# Patient Record
Sex: Male | Born: 1970 | Race: White | Hispanic: No | Marital: Single | Smoking: Current every day smoker
Health system: Southern US, Community
[De-identification: ages and names within clinical notes are randomized; demographics above are authoritative.]

## PROBLEM LIST (undated history)

## (undated) DIAGNOSIS — S3992XA Unspecified injury of lower back, initial encounter: Secondary | ICD-10-CM

## (undated) HISTORY — PX: BACK SURGERY: SHX140

## (undated) HISTORY — PX: HERNIA REPAIR: SHX51

---

## 2002-02-21 ENCOUNTER — Emergency Department (HOSPITAL_COMMUNITY): Admission: EM | Admit: 2002-02-21 | Discharge: 2002-02-21 | Payer: Self-pay | Admitting: Emergency Medicine

## 2003-03-30 ENCOUNTER — Emergency Department (HOSPITAL_COMMUNITY): Admission: AD | Admit: 2003-03-30 | Discharge: 2003-03-30 | Payer: Self-pay | Admitting: Internal Medicine

## 2003-04-04 ENCOUNTER — Emergency Department (HOSPITAL_COMMUNITY): Admission: EM | Admit: 2003-04-04 | Discharge: 2003-04-04 | Payer: Self-pay | Admitting: Emergency Medicine

## 2006-05-11 ENCOUNTER — Emergency Department (HOSPITAL_COMMUNITY): Admission: EM | Admit: 2006-05-11 | Discharge: 2006-05-12 | Payer: Self-pay | Admitting: Emergency Medicine

## 2011-10-22 ENCOUNTER — Encounter (HOSPITAL_COMMUNITY): Payer: Self-pay | Admitting: *Deleted

## 2011-10-22 ENCOUNTER — Emergency Department (HOSPITAL_COMMUNITY)
Admission: EM | Admit: 2011-10-22 | Discharge: 2011-10-22 | Disposition: A | Discharge status: Home / Self Care | Payer: Self-pay | Source: Home / Self Care | Attending: Emergency Medicine | Admitting: Emergency Medicine

## 2011-10-22 DIAGNOSIS — S61209A Unspecified open wound of unspecified finger without damage to nail, initial encounter: Secondary | ICD-10-CM

## 2011-10-22 DIAGNOSIS — S61210A Laceration without foreign body of right index finger without damage to nail, initial encounter: Secondary | ICD-10-CM

## 2011-10-22 MED ORDER — HYDROCODONE-ACETAMINOPHEN 5-325 MG PO TABS
2.0000 | ORAL_TABLET | Freq: Once | ORAL | Status: AC
  Administered 2011-10-22: 2 via ORAL

## 2011-10-22 MED ORDER — HYDROCODONE-ACETAMINOPHEN 5-325 MG PO TABS
ORAL_TABLET | ORAL | Status: AC
  Filled 2011-10-22: qty 2

## 2011-10-22 MED ORDER — HYDROCODONE-ACETAMINOPHEN 5-325 MG PO TABS: 2.0000 | ORAL_TABLET | ORAL | Status: AC | PRN

## 2011-10-22 NOTE — ED Provider Notes (Signed)
History     CSN: [id number]  Arrival date & time 10/22/11  4098   First MD Initiated Contact with Patient 10/22/11 1009      Chief Complaint  Patient presents with  . Laceration    (Consider location/radiation/quality/duration/timing/severity/associated sxs/prior treatment) Patient is a 41 y.o. male presenting with skin laceration. The history is provided by the patient. No language interpreter was used.  Laceration  The incident occurred less than 1 hour ago. The laceration is 1 cm in size. The pain is at a severity of 5/10. The pain is moderate. The pain has been constant since onset. His tetanus status is out of date.  Pt complains of a laceration to his right index finger from hedge clippers  History reviewed. No pertinent past medical history.  History reviewed. No pertinent past surgical history.  No family history on file.  History  Substance Use Topics  . Smoking status: Current Everyday Smoker  . Smokeless tobacco: Not on file  . Alcohol Use: No      Review of Systems  Skin: Positive for wound.  All other systems reviewed and are negative.    Allergies  Codeine  Home Medications  No current outpatient prescriptions on file.  BP 150/90  Pulse 91  Temp 98.5 F (36.9 C)  Resp 18  SpO2 96%  Physical Exam  Nursing note and vitals reviewed. Constitutional: He is oriented to person, place, and time. He appears well-developed and well-nourished.  Musculoskeletal:       1cm laceration right index finger  Neurological: He is alert and oriented to person, place, and time. He has normal reflexes.  Skin: Skin is warm and dry.  Psychiatric: He has a normal mood and affect.    ED Course  LACERATION REPAIR Date/Time: 10/22/2011 10:29 AM Performed by: Elson Areas Authorized by: Luiz Blare Consent: Verbal consent obtained. Risks and benefits: risks, benefits and alternatives were discussed Consent given by: patient Time out: Immediately prior  to procedure a "time out" was called to verify the correct patient, procedure, equipment, support staff and site/side marked as required. Body area: upper extremity Location details: right index finger Laceration length: 1 cm Contamination: The wound is contaminated. Foreign bodies: no foreign bodies Tendon involvement: none Nerve involvement: none Skin closure: glue Approximation difficulty: simple Patient tolerance: Patient tolerated the procedure well with no immediate complications.   (including critical care time)  Labs Reviewed - No data to display No results found.   1. Laceration of right index finger w/o foreign body w/o damage to nail       MDM  Splint over finger.   Pt advised to return if any problems.       Lonia Skinner [place], [place] 10/22/11 [address] [place], [place] 10/22/11 1034

## 2011-10-22 NOTE — ED Notes (Addendum)
Pt is here with hedgetrimmer laceration to right pointer finger.  Accident happened this am.  Bandage removed, wound washed and is soaking.

## 2011-10-22 NOTE — Discharge Instructions (Signed)
Fingertip Injuries and Amputations  Fingertip injuries are common and often get injured because they are last to escape when pulling your hand out of harm's way. You have amputated (cut off) part of your finger. How this turns out depends largely on how much was amputated. If just the tip is amputated, often the end of the finger will grow back and the finger may return to much the same as it was before the injury.   If more of the finger is missing, your caregiver has done the best with the tissue remaining to allow you to keep as much finger as is possible. Your caregiver after checking your injury has tried to leave you with a painless fingertip that has durable, feeling skin. If possible, your caregiver has tried to maintain the finger's length and appearance and preserve its fingernail.   Please read the instructions outlined below and refer to this sheet in the next few weeks. These instructions provide you with general information on caring for yourself. Your caregiver may also give you specific instructions. While your treatment has been done according to the most current medical practices available, unavoidable complications occasionally occur. If you have any problems or questions after discharge, please call your caregiver.  HOME CARE INSTRUCTIONS    You may resume normal diet and activities as directed or allowed.   Keep your hand elevated above the level of your heart. This helps decrease pain and swelling.   Keep ice packs (or a bag of ice wrapped in a towel) on the injured area for 15 to 20 minutes, 3 to 4 times per day, for the first two days.   Change dressings if necessary or as directed.   Clean the wound daily or as directed.   Only take over-the-counter or prescription medicines for pain, discomfort, or fever as directed by your caregiver.   Keep appointments as directed.  SEEK IMMEDIATE MEDICAL CARE IF:   You develop redness, swelling, numbness or increasing pain in the wound.   There  is pus coming from the wound.   You develop an unexplained oral temperature above 102 F (38.9 C) or as your caregiver suggests.   There is a foul (bad) smell coming from the wound or dressing.   There is a breaking open of the wound (edges not staying together) after sutures or staples have been removed.  MAKE SURE YOU:    Understand these instructions.   Will watch your condition.   Will get help right away if you are not doing well or get worse.  Document Released: 04/02/2005 Document Revised: 05/01/2011 Document Reviewed: 03/01/2008  ExitCare Patient Information 2012 [employer].

## 2011-10-23 NOTE — ED Provider Notes (Signed)
Medical screening examination/treatment/procedure(s) were performed by non-physician practitioner and as supervising physician I was immediately available for consultation/collaboration.  Luiz Blare MD   Luiz Blare, MD 10/23/11 [phone]

## 2014-01-08 ENCOUNTER — Emergency Department (HOSPITAL_COMMUNITY): Payer: Self-pay

## 2014-01-08 ENCOUNTER — Emergency Department (HOSPITAL_COMMUNITY)
Admission: EM | Admit: 2014-01-08 | Discharge: 2014-01-08 | Disposition: A | Discharge status: Home / Self Care | Payer: Self-pay | Attending: Emergency Medicine | Admitting: Emergency Medicine

## 2014-01-08 ENCOUNTER — Encounter (HOSPITAL_COMMUNITY): Payer: Self-pay | Admitting: Emergency Medicine

## 2014-01-08 DIAGNOSIS — S32009A Unspecified fracture of unspecified lumbar vertebra, initial encounter for closed fracture: Secondary | ICD-10-CM | POA: Insufficient documentation

## 2014-01-08 DIAGNOSIS — S22000A Wedge compression fracture of unspecified thoracic vertebra, initial encounter for closed fracture: Secondary | ICD-10-CM

## 2014-01-08 DIAGNOSIS — Y9389 Activity, other specified: Secondary | ICD-10-CM | POA: Insufficient documentation

## 2014-01-08 DIAGNOSIS — S32000A Wedge compression fracture of unspecified lumbar vertebra, initial encounter for closed fracture: Secondary | ICD-10-CM

## 2014-01-08 DIAGNOSIS — Y990 Civilian activity done for income or pay: Secondary | ICD-10-CM | POA: Insufficient documentation

## 2014-01-08 DIAGNOSIS — Z79899 Other long term (current) drug therapy: Secondary | ICD-10-CM | POA: Insufficient documentation

## 2014-01-08 DIAGNOSIS — S22009A Unspecified fracture of unspecified thoracic vertebra, initial encounter for closed fracture: Secondary | ICD-10-CM | POA: Insufficient documentation

## 2014-01-08 DIAGNOSIS — R109 Unspecified abdominal pain: Secondary | ICD-10-CM | POA: Insufficient documentation

## 2014-01-08 DIAGNOSIS — F17200 Nicotine dependence, unspecified, uncomplicated: Secondary | ICD-10-CM | POA: Insufficient documentation

## 2014-01-08 DIAGNOSIS — IMO0002 Reserved for concepts with insufficient information to code with codable children: Secondary | ICD-10-CM | POA: Insufficient documentation

## 2014-01-08 DIAGNOSIS — Y9289 Other specified places as the place of occurrence of the external cause: Secondary | ICD-10-CM | POA: Insufficient documentation

## 2014-01-08 DIAGNOSIS — W11XXXA Fall on and from ladder, initial encounter: Secondary | ICD-10-CM | POA: Insufficient documentation

## 2014-01-08 LAB — URINALYSIS, ROUTINE W REFLEX MICROSCOPIC
Bilirubin Urine: NEGATIVE
Glucose, UA: NEGATIVE mg/dL
Hgb urine dipstick: NEGATIVE
Ketones, ur: NEGATIVE mg/dL
Leukocytes, UA: NEGATIVE
Nitrite: NEGATIVE
Protein, ur: NEGATIVE mg/dL
Specific Gravity, Urine: 1.026 (ref 1.005–1.030)
Urobilinogen, UA: 0.2 mg/dL (ref 0.0–1.0)
pH: 5.5 (ref 5.0–8.0)

## 2014-01-08 MED ORDER — HYDROMORPHONE HCL PF 2 MG/ML IJ SOLN
2.0000 mg | Freq: Once | INTRAMUSCULAR | Status: AC
  Administered 2014-01-08: 2 mg via INTRAMUSCULAR
  Filled 2014-01-08: qty 1

## 2014-01-08 MED ORDER — IOHEXOL 300 MG/ML  SOLN
50.0000 mL | Freq: Once | INTRAMUSCULAR | Status: AC | PRN
  Administered 2014-01-08: 50 mL via ORAL

## 2014-01-08 MED ORDER — OXYCODONE-ACETAMINOPHEN 5-325 MG PO TABS: 1.0000 | ORAL_TABLET | ORAL | Status: DC | PRN

## 2014-01-08 MED ORDER — IOHEXOL 300 MG/ML  SOLN
100.0000 mL | Freq: Once | INTRAMUSCULAR | Status: AC | PRN
  Administered 2014-01-08: 100 mL via INTRAVENOUS

## 2014-01-08 NOTE — ED Provider Notes (Signed)
CSN: [id number]     Arrival date & time 01/08/14  1818 History   First MD Initiated Contact with Patient 01/08/14 1831     Chief Complaint  Patient presents with  . Back Pain  . Fall     (Consider location/radiation/quality/duration/timing/severity/associated sxs/prior Treatment) HPI This is a 43 year old male who states that he fell off of a roof yesterday while he was working. He states he landed on his back and to her. He denies striking his head or loss of consciousness. He denies that alcohol was involved. He states that he was able to get up at that time. He has noted increased pain through to his back today. He describes it as diffuse from his upper back to his lower back. He denies any lateralized weakness, numbness, or tingling. He has not felt lightheaded, short of breath, or had vomiting. He does have some pain when he takes a deep breath. History reviewed. No pertinent past medical history. History reviewed. No pertinent past surgical history. No family history on file. History  Substance Use Topics  . Smoking status: Current Every Day Smoker  . Smokeless tobacco: Not on file  . Alcohol Use: No    Review of Systems  All other systems reviewed and are negative.     Allergies  Codeine  Home Medications   Prior to Admission medications   Medication Sig Start Date End Date Taking? Authorizing Provider  Aspirin-Acetaminophen (GOODYS BODY PAIN PO) Take 1 each by mouth daily.   Yes Historical Provider, MD   BP 119/58  Pulse 85  Temp(Src) 98.5 F (36.9 C) (Oral)  Resp 18  SpO2 98% Physical Exam  Nursing note and vitals reviewed. Constitutional: He is oriented to person, place, and time. He appears well-developed and well-nourished.  HENT:  Head: Normocephalic and atraumatic.  Right Ear: External ear normal.  Left Ear: External ear normal.  Nose: Nose normal.  Mouth/Throat: Oropharynx is clear and moist.  Eyes: Conjunctivae and EOM are normal. Pupils are equal,  round, and reactive to light.  Neck: Normal range of motion. Neck supple.  Cardiovascular: Normal rate, regular rhythm, normal heart sounds and intact distal pulses.   Pulmonary/Chest: Effort normal and breath sounds normal. No respiratory distress. He has no wheezes. He exhibits no tenderness.  Abdominal: Soft. Bowel sounds are normal. He exhibits no distension and no mass. There is tenderness. There is no guarding.  Mild bilateral tenderness to palpation upper quadrant.  Musculoskeletal: Normal range of motion.  Moderate diffuse tenderness to palpation upper mid and lower back with no specific point tenderness noted.  Neurological: He is alert and oriented to person, place, and time. He has normal reflexes. No cranial nerve deficit. He exhibits normal muscle tone. Coordination normal.  Skin: Skin is warm and dry.  Psychiatric: He has a normal mood and affect. His behavior is normal. Judgment and thought content normal.    ED Course  Procedures (including critical care time) Labs Review Labs Reviewed  URINALYSIS, ROUTINE W REFLEX MICROSCOPIC    Imaging Review Dg Chest 2 View  01/08/2014   CLINICAL DATA:  Status post fall.  Back pain.  EXAM: CHEST  2 VIEW  COMPARISON:  None.  FINDINGS: Heart size and mediastinal contours are within normal limits. Both lungs are clear. Visualized skeletal structures are unremarkable.  IMPRESSION: Negative exam.   Electronically Signed   By: Drusilla Kanner M.D.   On: 01/08/2014 19:28   Dg Cervical Spine Complete  01/08/2014   CLINICAL DATA:  Status post fall.  Neck pain.  EXAM: CERVICAL SPINE  4+ VIEWS  COMPARISON:  None.  FINDINGS: Vertebral body height and alignment are maintained. Intervertebral disc space height is normal with mild anterior endplate spurring noted. The facet joints are unremarkable. Neural foramina appear open. Prevertebral soft tissues are normal and the lung apices are clear.  IMPRESSION: No acute finding with very mild appearing  degenerative change noted.   Electronically Signed   By: Drusilla Kannerhomas  Dalessio M.D.   On: 01/08/2014 19:28   Ct Abdomen Pelvis W Contrast  01/08/2014   CLINICAL DATA:  Larey SeatFell off ladder.  Back and flank pain.  EXAM: CT ABDOMEN AND PELVIS WITH CONTRAST  TECHNIQUE: Multidetector CT imaging of the abdomen and pelvis was performed using the standard protocol following bolus administration of intravenous contrast.  CONTRAST:  50mL OMNIPAQUE IOHEXOL 300 MG/ML SOLN, 100mL OMNIPAQUE IOHEXOL 300 MG/ML SOLN  COMPARISON:  None.  FINDINGS: Lung bases:  The dependent bibasilar atelectasis but no pleural effusion or basilar pneumothorax. The visualized lower left ribs are intact. No definite rib fractures.  CT abdomen:  The solid abdominal organs are intact. No acute injury or mass. The gallbladder is normal. No common bile duct dilatation. The major venous structures are patent. The aorta is normal in caliber. The branch vessels are patent. A circumaortic left renal vein is noted.  The stomach, duodenum, small bowel and colon are unremarkable. No inflammatory changes, mass lesions or obstructive findings. The appendix is normal. No mesenteric or retroperitoneal mass or adenopathy. Small scattered lymph nodes are noted.  CT pelvis:  The bladder, prostate gland and seminal vesicles are unremarkable. No pelvic mass, adenopathy or hematoma. No inguinal mass or adenopathy.  Bony structures:  Both hips are normally located. The pubic symphysis and SI joints are intact. No pelvic fractures. The lumbar vertebral bodies are normally aligned and the facets are normally aligned. There are fractures involving the T12 and L1 vertebral bodies. No retropulsion or canal compromise. No facet or laminar fractures. Small amount of paraspinal hematoma. No transverse process or spinous process fractures.  IMPRESSION: 1. T12 and L1 compression fractures without retropulsion or canal compromise. The posterior elements are intact. 2. No acute  abdominal/pelvic findings.   Electronically Signed   By: Loralie ChampagneMark  Gallerani M.D.   On: 01/08/2014 21:14     MDM   Final diagnoses:  Thoracic compression fracture, closed, initial encounter  Lumbar compression fracture, closed, initial encounter   Patient fell from roof yesterday and has t12 and l1 compression fracture without canal compromis.  He had ct abdomen and neck x-rays and chest x-Audrielle Vankuren negative.   Patient feels improved and is moving without difficulty.  I have discussed injury with patient including return precautions (specifically neuro deficits) and prognosis.  He voices understanding and is given percocet rx.  He is advised and states he will segue to ot meds after these.  He will follow up as needed but states he has no primary care provider.      Hilario Quarryanielle S Nivek Powley, MD 01/09/14 [phone]

## 2014-01-08 NOTE — Discharge Instructions (Signed)
Back, Compression Fracture °A compression fracture happens when a force is put upon the length of your spine. Slipping and falling on your bottom are examples of such a force. When this happens, sometimes the force is great enough to compress the building blocks (vertebral bodies) of your spine. Although this causes a lot of pain, this can usually be treated at home, unless your caregiver feels hospitalization is needed for pain control. °Your backbone (spinal column) is made up of 24 main vertebral bodies in addition to the sacrum and coccyx (see illustration). These are held together by tough fibrous tissues (ligaments) and by support of your muscles. Nerve roots pass through the openings between the vertebrae. A sudden wrenching move, injury, or a fall may cause a compression fracture of one of the vertebral bodies. This may result in back pain or spread of pain into the belly (abdomen), the buttocks, and down the leg into the foot. Pain may also be created by muscle spasm alone. °Large studies have been undertaken to determine the best possible course of action to help your back following injury and also to prevent future problems. The recommendations are as follows. °FOLLOWING A COMPRESSION FRACTURE: °Do the following only if advised by your caregiver.  °· If a back brace has been suggested or provided, wear it as directed. °· Do not stop wearing the back brace unless instructed by your caregiver. °· When allowed to return to regular activities, avoid a sedentary lifestyle. Actively exercise. Sporadic weekend binges of tennis, racquetball, or waterskiing may actually aggravate or create problems, especially if you are not in condition for that activity. °· Avoid sports requiring sudden body movements until you are in condition for them. Swimming and walking are safer activities. °· Maintain good posture. °· Avoid obesity. °· If not already done, you should have a DEXA scan. Based on the results, be treated for  osteoporosis. °FOLLOWING ACUTE (SUDDEN) INJURY: °· Only take over-the-counter or prescription medicines for pain, discomfort, or fever as directed by your caregiver. °· Use bed rest for only the most extreme acute episode. Prolonged bed rest may aggravate your condition. Ice used for acute conditions is effective. Use a large plastic bag filled with ice. Wrap it in a towel. This also provides excellent pain relief. This may be continuous. Or use it for 30 minutes every 2 hours during acute phase, then as needed. Heat for 30 minutes prior to activities is helpful. °· As soon as the acute phase (the time when your back is too painful for you to do normal activities) is over, it is important to resume normal activities and work hardening programs. Back injuries can cause potentially marked changes in lifestyle. So it is important to attack these problems aggressively. °· See your caregiver for continued problems. He or she can help or refer you for appropriate exercises, physical therapy, and work hardening if needed. °· If you are given narcotic medications for your condition, for the next 24 hours do not: °¨ Drive. °¨ Operate machinery or power tools. °¨ Sign legal documents. °· Do not drink alcohol, or take sleeping pills or other medications that may interfere with treatment. °If your caregiver has given you a follow-up appointment, it is very important to keep that appointment. Not keeping the appointment could result in a chronic or permanent injury, pain, and disability. If there is any problem keeping the appointment, you must call back to this facility for assistance.  °SEEK IMMEDIATE MEDICAL CARE IF: °· You develop numbness,   tingling, weakness, or problems with the use of your arms or legs. °· You develop severe back pain not relieved with medications. °· You have changes in bowel or bladder control. °· You have increasing pain in any areas of the body. °Document Released: 05/12/2005 Document Revised:  09/26/2013 Document Reviewed: 12/15/2007 °ExitCare® Patient Information ©2015 [employer]. This information is not intended to replace advice given to you by your health care provider. Make sure you discuss any questions you have with your health care provider. ° °

## 2014-01-08 NOTE — ED Notes (Signed)
Patient transported to CT 

## 2014-01-08 NOTE — ED Notes (Signed)
Pt reports slipping off a ladder that was 14 feet high. Pt states he landed on his back onto the ground, which was dirt (no cement or rocks). Pt denies having a LOC. Pt is A/O x4, ambulatory to triage, vitals are WDL. Pt states it is uncomfortable to sit up and prefers to lay on his side.

## 2014-01-13 ENCOUNTER — Encounter (HOSPITAL_COMMUNITY): Payer: Self-pay | Admitting: Emergency Medicine

## 2014-01-13 ENCOUNTER — Emergency Department (HOSPITAL_COMMUNITY)
Admission: EM | Admit: 2014-01-13 | Discharge: 2014-01-13 | Disposition: A | Discharge status: Home / Self Care | Payer: Self-pay | Attending: Emergency Medicine | Admitting: Emergency Medicine

## 2014-01-13 DIAGNOSIS — S32000S Wedge compression fracture of unspecified lumbar vertebra, sequela: Secondary | ICD-10-CM

## 2014-01-13 DIAGNOSIS — M545 Low back pain, unspecified: Secondary | ICD-10-CM | POA: Insufficient documentation

## 2014-01-13 DIAGNOSIS — Z79899 Other long term (current) drug therapy: Secondary | ICD-10-CM | POA: Insufficient documentation

## 2014-01-13 DIAGNOSIS — G8911 Acute pain due to trauma: Secondary | ICD-10-CM | POA: Insufficient documentation

## 2014-01-13 DIAGNOSIS — R3915 Urgency of urination: Secondary | ICD-10-CM | POA: Insufficient documentation

## 2014-01-13 DIAGNOSIS — F17200 Nicotine dependence, unspecified, uncomplicated: Secondary | ICD-10-CM | POA: Insufficient documentation

## 2014-01-13 DIAGNOSIS — Z7982 Long term (current) use of aspirin: Secondary | ICD-10-CM | POA: Insufficient documentation

## 2014-01-13 DIAGNOSIS — Z87828 Personal history of other (healed) physical injury and trauma: Secondary | ICD-10-CM | POA: Insufficient documentation

## 2014-01-13 DIAGNOSIS — M549 Dorsalgia, unspecified: Secondary | ICD-10-CM | POA: Insufficient documentation

## 2014-01-13 HISTORY — DX: Unspecified injury of lower back, initial encounter: S39.92XA

## 2014-01-13 MED ORDER — OXYCODONE-ACETAMINOPHEN 5-325 MG PO TABS: 1.0000 | ORAL_TABLET | Freq: Three times a day (TID) | ORAL | Status: DC | PRN

## 2014-01-13 NOTE — ED Notes (Signed)
Pt fell from room one week ago. Pt stated that he has increased pain in low back. Evaluated 6 days ago for same concern. Denies numbness and tingling in legs

## 2014-01-13 NOTE — Progress Notes (Signed)
  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 c/o back pain     Subjective/Objective Assessment Detail:   pt states no pcp Has not seen a pain management provider  Confirms no orange card States his girlfriend has one and he has called [facility] without a response at this time     Action/Plan:   ED CM spoke with pt on importance of pcp CM discussed pain management providers   Action/Plan Detail:   Anticipated DC Date:  01/13/2014     Status Recommendation to Physician:   Result of Recommendation:    Other ED Services  Consult Working Plan    DC Planning Services  Other  PCP issues  Outpatient Services - Pt will follow up    Choice offered to / List presented to:            Status of service:  Completed, signed off  ED Comments:   ED Comments Detail:  CM spoke with pt who [place]. CM discussed and provided written information for self pay pcps, importance of pcp for f/u care, www.needymeds.org, discounted pharmacies and other [place] resources such as [facility], [facility], affordable care act,  financial assistance, DSS and  health department Reviewed resources for [place] self pay pcps like [facility], family medicine at [address], [facility], general medical clinics, [facility] plus others, medication resources, [facility] out patient pharmacies and housing Pt voiced understanding and appreciation of resources provided  Provided [facility] contact information Referral completed  referal completed -[facility] [place] [facility] Pt states [facility] awaiting a return call to be seen and orange card assist

## 2014-01-13 NOTE — ED Provider Notes (Signed)
Medical screening examination/treatment/procedure(s) were performed by non-physician practitioner and as supervising physician I was immediately available for consultation/collaboration.   EKG Interpretation None        Purvis SheffieldForrest Chinmayi Rumer, MD 01/13/14 1709

## 2014-01-13 NOTE — ED Provider Notes (Signed)
CSN: [id number]     Arrival date & time 01/13/14  1427 History  This chart was scribed for Fayrene HelperBowie Aliviana Burdell, PA, working with Purvis SheffieldForrest Harrison, MD found by Elon SpannerGarrett Cook, ED Scribe. This patient was seen in room WTR5/WTR5 and the patient's care was started at 2:46 PM.    No chief complaint on file.  The history is provided by the patient. No language interpreter was used.    HPI Comments: Frederick Thompson is a 43 y.o. male who presents to the Emergency Department complaining of constant, unchanged, sharp back pain with intermittent radiation down his left leg through his buttocks.  Patient states he fell of a 14 ft high roof on 8/15 while working on his home.  Patient was seen in the ED on 8/16 and states he he was diagnosed with a lumbar compression fracture.  Patient was referred to PCP but denies having one, patient was told to return to ED if pain persisted.  Patient states he was prescribed Percocet in the ED but took his last half of a tablet this morning.  Patient also states he has taken 6 [employer] today.  Patient reports associated urinary urgency.  Patient denies urinary/bowel incontinence, numbness  No past medical history on file. No past surgical history on file. No family history on file. History  Substance Use Topics  . Smoking status: Current Every Day Smoker  . Smokeless tobacco: Not on file  . Alcohol Use: No    Review of Systems  Genitourinary: Positive for urgency.  Musculoskeletal: Positive for back pain.  Neurological: Negative for numbness.      Allergies  Codeine  Home Medications   Prior to Admission medications   Medication Sig Start Date End Date Taking? Authorizing Provider  Aspirin-Acetaminophen (GOODYS BODY PAIN PO) Take 1 each by mouth daily.    Historical Provider, MD  oxyCODONE-acetaminophen (PERCOCET/ROXICET) 5-325 MG per tablet Take 1 tablet by mouth every 4 (four) hours as needed for severe pain. 01/08/14   Hilario Quarryanielle S Ray, MD   There were no  vitals taken for this visit. Physical Exam  Nursing note and vitals reviewed. Constitutional: He is oriented to person, place, and time. He appears well-developed and well-nourished. No distress.  HENT:  Head: Normocephalic and atraumatic.  Eyes: Conjunctivae and EOM are normal.  Neck: Neck supple. No tracheal deviation present.  Cardiovascular: Normal rate.   Pulmonary/Chest: Effort normal. No respiratory distress.  Musculoskeletal: Normal range of motion.  Tenderness to mid to lower lumbar spin upon palpation no crepitus no step-off.  Paralumbar spine tenderness with no step-off. Patella DTR's intact.  Patient is able to ambulate.    Neurological: He is alert and oriented to person, place, and time.  Skin: Skin is warm and dry.  Psychiatric: He has a normal mood and affect. His behavior is normal.    ED Course  Procedures (including critical care time)  DIAGNOSTIC STUDIES: Oxygen Saturation is 99% on room air, normal by my interpretation.    COORDINATION OF CARE:  2:52 PM persistent back pain from previous T12-L1 compression fx from falling off a roof. No red flags.  Pt is NVI. Able to ambulate. Unable to f/u with PCP or specialist despite multiple attempts.  Discussed with patient the findings of his previous ED visit as well as need to control pain.  Will order pain medication.  Counseled patient on smoking cessation.    Labs Review Labs Reviewed - No data to display  Imaging Review No results found.  EKG Interpretation None      MDM   Final diagnoses:  Compression fracture of lumbar vertebra, sequela    BP 125/72  Pulse 75  Temp(Src) 98.3 F (36.8 C) (Oral)  Resp 20  SpO2 99%   I personally performed the services described in this documentation, which was scribed in my presence. The recorded information has been reviewed and is accurate.      Fayrene Helper, PA-C 01/13/14 1506  Fayrene Helper, PA-C 01/13/14 1517

## 2014-01-13 NOTE — Discharge Instructions (Signed)
You have a T12-L1 compression fracture from your recent fall.  Take pain medication as needed.  Follow up with a specialist for further management.  Return if you develop new numbness, or if you have other concerns.   Back, Compression Fracture A compression fracture happens when a force is put upon the length of your spine. Slipping and falling on your bottom are examples of such a force. When this happens, sometimes the force is great enough to compress the building blocks (vertebral bodies) of your spine. Although this causes a lot of pain, this can usually be treated at home, unless your caregiver feels hospitalization is needed for pain control. Your backbone (spinal column) is made up of 24 main vertebral bodies in addition to the sacrum and coccyx (see illustration). These are held together by tough fibrous tissues (ligaments) and by support of your muscles. Nerve roots pass through the openings between the vertebrae. A sudden wrenching move, injury, or a fall may cause a compression fracture of one of the vertebral bodies. This may result in back pain or spread of pain into the belly (abdomen), the buttocks, and down the leg into the foot. Pain may also be created by muscle spasm alone. Large studies have been undertaken to determine the best possible course of action to help your back following injury and also to prevent future problems. The recommendations are as follows. FOLLOWING A COMPRESSION FRACTURE: Do the following only if advised by your caregiver.   If a back brace has been suggested or provided, wear it as directed.  Do not stop wearing the back brace unless instructed by your caregiver.  When allowed to return to regular activities, avoid a sedentary lifestyle. Actively exercise. Sporadic weekend binges of tennis, racquetball, or waterskiing may actually aggravate or create problems, especially if you are not in condition for that activity.  Avoid sports requiring sudden body  movements until you are in condition for them. Swimming and walking are safer activities.  Maintain good posture.  Avoid obesity.  If not already done, you should have a DEXA scan. Based on the results, be treated for osteoporosis. FOLLOWING ACUTE (SUDDEN) INJURY:  Only take over-the-counter or prescription medicines for pain, discomfort, or fever as directed by your caregiver.  Use bed rest for only the most extreme acute episode. Prolonged bed rest may aggravate your condition. Ice used for acute conditions is effective. Use a large plastic bag filled with ice. Wrap it in a towel. This also provides excellent pain relief. This may be continuous. Or use it for 30 minutes every 2 hours during acute phase, then as needed. Heat for 30 minutes prior to activities is helpful.  As soon as the acute phase (the time when your back is too painful for you to do normal activities) is over, it is important to resume normal activities and work Arboriculturisthardening programs. Back injuries can cause potentially marked changes in lifestyle. So it is important to attack these problems aggressively.  See your caregiver for continued problems. He or she can help or refer you for appropriate exercises, physical therapy, and work hardening if needed.  If you are given narcotic medications for your condition, for the next 24 hours do not:  Drive.  Operate machinery or power tools.  Sign legal documents.  Do not drink alcohol, or take sleeping pills or other medications that may interfere with treatment. If your caregiver has given you a follow-up appointment, it is very important to keep that appointment. Not keeping  the appointment could result in a chronic or permanent injury, pain, and disability. If there is any problem keeping the appointment, you must call back to this facility for assistance.  SEEK IMMEDIATE MEDICAL CARE IF:  You develop numbness, tingling, weakness, or problems with the use of your arms or  legs.  You develop severe back pain not relieved with medications.  You have changes in bowel or bladder control.  You have increasing pain in any areas of the body. Document Released: 05/12/2005 Document Revised: 09/26/2013 Document Reviewed: 12/15/2007 [facility] Patient Information 2015 [place], [place]. This information is not intended to replace advice given to you by your health care provider. Make sure you discuss any questions you have with your health care provider.

## 2014-02-14 ENCOUNTER — Ambulatory Visit: Payer: Self-pay

## 2014-03-20 ENCOUNTER — Emergency Department (HOSPITAL_COMMUNITY)
Admission: EM | Admit: 2014-03-20 | Discharge: 2014-03-20 | Disposition: A | Discharge status: Home / Self Care | Payer: Self-pay | Attending: Emergency Medicine | Admitting: Emergency Medicine

## 2014-03-20 ENCOUNTER — Encounter (HOSPITAL_COMMUNITY): Payer: Self-pay | Admitting: Emergency Medicine

## 2014-03-20 ENCOUNTER — Emergency Department (HOSPITAL_COMMUNITY): Payer: Self-pay

## 2014-03-20 DIAGNOSIS — Y9389 Activity, other specified: Secondary | ICD-10-CM | POA: Insufficient documentation

## 2014-03-20 DIAGNOSIS — S0990XA Unspecified injury of head, initial encounter: Secondary | ICD-10-CM | POA: Insufficient documentation

## 2014-03-20 DIAGNOSIS — S0993XA Unspecified injury of face, initial encounter: Secondary | ICD-10-CM | POA: Insufficient documentation

## 2014-03-20 DIAGNOSIS — W01198A Fall on same level from slipping, tripping and stumbling with subsequent striking against other object, initial encounter: Secondary | ICD-10-CM | POA: Insufficient documentation

## 2014-03-20 DIAGNOSIS — Z79899 Other long term (current) drug therapy: Secondary | ICD-10-CM | POA: Insufficient documentation

## 2014-03-20 DIAGNOSIS — Z87828 Personal history of other (healed) physical injury and trauma: Secondary | ICD-10-CM | POA: Insufficient documentation

## 2014-03-20 DIAGNOSIS — S02609A Fracture of mandible, unspecified, initial encounter for closed fracture: Secondary | ICD-10-CM | POA: Insufficient documentation

## 2014-03-20 DIAGNOSIS — Z720 Tobacco use: Secondary | ICD-10-CM | POA: Insufficient documentation

## 2014-03-20 DIAGNOSIS — Y9289 Other specified places as the place of occurrence of the external cause: Secondary | ICD-10-CM | POA: Insufficient documentation

## 2014-03-20 MED ORDER — AMOXICILLIN 500 MG PO CAPS
500.0000 mg | ORAL_CAPSULE | Freq: Once | ORAL | Status: AC
  Administered 2014-03-20: 500 mg via ORAL
  Filled 2014-03-20: qty 1

## 2014-03-20 MED ORDER — OXYCODONE-ACETAMINOPHEN 5-325 MG PO TABS
1.0000 | ORAL_TABLET | Freq: Once | ORAL | Status: AC
  Administered 2014-03-20: 1 via ORAL
  Filled 2014-03-20: qty 1

## 2014-03-20 MED ORDER — HYDROMORPHONE HCL 1 MG/ML IJ SOLN
1.0000 mg | Freq: Once | INTRAMUSCULAR | Status: AC
  Administered 2014-03-20: 1 mg via INTRAMUSCULAR
  Filled 2014-03-20: qty 1

## 2014-03-20 MED ORDER — OXYCODONE-ACETAMINOPHEN 5-325 MG PO TABS: 1.0000 | ORAL_TABLET | ORAL | Status: DC | PRN

## 2014-03-20 MED ORDER — AMOXICILLIN 500 MG PO CAPS: 500.0000 mg | ORAL_CAPSULE | Freq: Three times a day (TID) | ORAL | Status: DC

## 2014-03-20 NOTE — ED Provider Notes (Signed)
CSN: [id number]     Arrival date & time 03/20/14  1748 History   First MD Initiated Contact with Patient 03/20/14 1937     Chief Complaint  Patient presents with  . Facial Pain  . Head Injury     (Consider location/radiation/quality/duration/timing/severity/associated sxs/prior Treatment) HPI Faythe DingwallRobert Tarpley is a 43 y.o. male with no major medical problems, presents to emergency department complaining of facial swelling and pain. Patient states that he was moving a "2 x 4"4 days ago when it fell and hit him on the face. Patient states since then left-sided facial swelling, unable to open his mouth all the way, unable to bite down without pain. He denies any loss of consciousness, no headache, nausea, vomiting, dizziness, amnesia. No neck pain. No other injuries. He has been taking Goody's powder with no relief of his pain. States left side of the face feels "numb."  Past Medical History  Diagnosis Date  . Back injury    Past Surgical History  Procedure Laterality Date  . Hernia repair     No family history on file. History  Substance Use Topics  . Smoking status: Current Every Day Smoker    Types: Cigarettes  . Smokeless tobacco: Not on file  . Alcohol Use: Yes    Review of Systems  Constitutional: Negative for fever and chills.  HENT: Positive for facial swelling. Negative for dental problem.   Genitourinary: Negative for dysuria, urgency, frequency and hematuria.  Musculoskeletal: Negative for arthralgias.  Skin: Negative for rash.  Allergic/Immunologic: Negative for immunocompromised state.  Neurological: Positive for numbness and headaches. Negative for dizziness, syncope, speech difficulty, weakness and light-headedness.      Allergies  Codeine  Home Medications   Prior to Admission medications   Medication Sig Start Date End Date Taking? Authorizing Provider  Aspirin-Acetaminophen (GOODYS BODY PAIN PO) Take 1 each by mouth daily.   Yes Historical Provider, MD    BP 168/92  Pulse 87  Temp(Src) 98 F (36.7 C) (Oral)  Resp 18  SpO2 98% Physical Exam  Nursing note and vitals reviewed. Constitutional: He is oriented to person, place, and time. He appears well-developed and well-nourished. No distress.  HENT:  Head: Normocephalic and atraumatic.  Swelling to the left mandible. Tender to palpation. No swelling under the tongue. Some trismus present, able to open his mouth to finger widths. Normal maxillary. Multiple dental caries.  Eyes: Conjunctivae and EOM are normal. Pupils are equal, round, and reactive to light.  Neck: Normal range of motion. Neck supple.  No meningismus  Cardiovascular: Normal rate, regular rhythm and normal heart sounds.   Pulmonary/Chest: Effort normal and breath sounds normal. No respiratory distress.  Musculoskeletal: He exhibits no edema.  Neurological: He is alert and oriented to person, place, and time. No cranial nerve deficit. He exhibits normal muscle tone. Coordination normal.  5/5 and equal upper and lower extremity strength bilaterally. Equal grip strength bilaterally. Normal finger to nose and heel to shin. No pronator drift.   Skin: Skin is warm and dry. No rash noted.  Psychiatric: He has a normal mood and affect. His behavior is normal.    ED Course  Procedures (including critical care time) Labs Review Labs Reviewed - No data to display  Imaging Review Ct Maxillofacial Wo Cm  03/20/2014   CLINICAL DATA:  Left-sided facial swelling after being hit by stack of wood.  EXAM: CT MAXILLOFACIAL WITHOUT CONTRAST  TECHNIQUE: Multidetector CT imaging of the maxillofacial structures was performed. Multiplanar CT image  reconstructions were also generated. A small metallic BB was placed on the right temple in order to reliably differentiate right from left.  COMPARISON:  None.  FINDINGS: Paranasal sinuses appear normal. Globes and orbits appear normal. Pterygoid plates appear normal. Severely displaced fracture is  seen involving the left mandible anterior to the mandibular angle and posterior to the molars. No other fracture or other bony abnormality is noted.  IMPRESSION: Severely displaced fracture is seen involving the left mandible, anterior to the mandibular angle and posterior to the molars.   Electronically Signed   By: Roque LiasJames  Green M.D.   On: 03/20/2014 21:18     EKG Interpretation None      MDM   Final diagnoses:  Facial injury  Mandible fracture, closed, initial encounter   Pt with left mandible injury. CT maxillofacial ordered.    10:00 PM Discussed with Dr. Chales Salmonwsley, recommended starting pt on antibiotics -amoxicillin, pain management. Call his office at 8am tomorrow and be seen tomorrow to be set up for surgery. Pt received first dose of amoxil in ED, received 1mg  of dilaudid IM and i percoset prior to d/c for pain.   Filed Vitals:   03/20/14 1808 03/20/14 2015 03/20/14 2207  BP: 144/88 168/92 142/100  Pulse: 96 87 85  Temp: 98.6 F (37 C) 98 F (36.7 C) 98.1 F (36.7 C)  TempSrc:  Oral Oral  Resp: 18 18 18   SpO2: 96% 98% 100%      Lottie Musselatyana A Jeanice Dempsey, PA-C 03/21/14 0026

## 2014-03-20 NOTE — Discharge Instructions (Signed)
Amoxil for infection. Percocet for pain. Follow up with Dr. Chales Salmonwsley, call tomorrow and follow up in the office.    Mandibular Fracture A mandibular fracture is a break in the jawbone. CAUSES  The most common cause of mandibular fracture is a direct blow (trauma) to the jaw. This could happen from:  A car crash.  Physical violence.  A fall from a high place. SYMPTOMS   Pain.  Swelling.  Difficulty and pain when closing the mouth.  Feeling that the teeth are not aligned properly when closing the mouth (malocclusion).  Difficulty speaking.  Difficulty swallowing. DIAGNOSIS  Your caregiver will take your history and perform a physical exam. He or she may also order imaging tests, such as X-rays or a computed tomography (CT) scan, to confirm your diagnosis. TREATMENT  Surgery is often needed to put the jaw back in the right position. Wires are usually placed around the teeth to hold the jaw in place while it heals. Treatment may also include pain medicine, ice, and a soft or liquid diet. HOME CARE INSTRUCTIONS   Put ice on the injured area.  Put ice in a plastic bag.  Place a towel between your skin and the bag.  Leave the ice on for 15-20 minutes, 03-04 times a day for the first 2 days.  Only take over-the-counter or prescription medicines for pain, discomfort, or fever as directed by your caregiver.  Eat a well-balanced, high-protein soft or liquid diet as directed by your caregiver.  If your jaws are wired, follow your caregiver's instructions for wired jaw care.  Sleep on your back to avoid putting pressure on your jaw.  Avoid exercising to the point that you become short of breath. SEEK MEDICAL CARE IF:   You have a severe headache or numbness in your face.  You have severe jaw pain that is not relieved with medicine.  Your jaw wires become loose.  You have uncontrollable nausea or anxiety.  Your swelling or redness gets worse. SEEK IMMEDIATE MEDICAL CARE  IF:  You have a fever.  You have difficulty breathing.  You feel like your airway is tightening.  You cannot swallow your saliva.  You make a high-pitched whistling sound when you breathe (wheezing). MAKE SURE YOU:   Understand these instructions.  Will watch your condition.  Will get help right away if you are not doing well or get worse. Document Released: 05/12/2005 Document Revised: 08/04/2011 Document Reviewed: 05/28/2011 [facility] Patient Information 2015 [place]. This information is not intended to replace advice given to you by your health care provider. Make sure you discuss any questions you have with your health care provider.

## 2014-03-20 NOTE — ED Notes (Signed)
Pt sts he was moving some 2x4 on Thursday and had stack of them fall and hit left side of his head and face. Pt has swelling to left jaw, sts left side of his face and head feels numb and numbness is spreading to his chest.

## 2014-03-21 NOTE — ED Provider Notes (Signed)
Medical screening examination/treatment/procedure(s) were performed by non-physician practitioner and as supervising physician I was immediately available for consultation/collaboration.   EKG Interpretation None        Taliya Mcclard J. Han Lysne, MD 03/21/14 0030 

## 2014-08-30 ENCOUNTER — Emergency Department (HOSPITAL_COMMUNITY)
Admission: EM | Admit: 2014-08-30 | Discharge: 2014-08-30 | Disposition: A | Discharge status: Home / Self Care | Payer: Self-pay | Attending: Emergency Medicine | Admitting: Emergency Medicine

## 2014-08-30 ENCOUNTER — Emergency Department (HOSPITAL_COMMUNITY): Payer: Self-pay

## 2014-08-30 ENCOUNTER — Encounter (HOSPITAL_COMMUNITY): Payer: Self-pay | Admitting: *Deleted

## 2014-08-30 DIAGNOSIS — S3992XA Unspecified injury of lower back, initial encounter: Secondary | ICD-10-CM | POA: Insufficient documentation

## 2014-08-30 DIAGNOSIS — Z720 Tobacco use: Secondary | ICD-10-CM | POA: Insufficient documentation

## 2014-08-30 DIAGNOSIS — Z792 Long term (current) use of antibiotics: Secondary | ICD-10-CM | POA: Insufficient documentation

## 2014-08-30 DIAGNOSIS — M5489 Other dorsalgia: Secondary | ICD-10-CM

## 2014-08-30 DIAGNOSIS — S46911A Strain of unspecified muscle, fascia and tendon at shoulder and upper arm level, right arm, initial encounter: Secondary | ICD-10-CM | POA: Insufficient documentation

## 2014-08-30 DIAGNOSIS — W19XXXA Unspecified fall, initial encounter: Secondary | ICD-10-CM

## 2014-08-30 DIAGNOSIS — Z79899 Other long term (current) drug therapy: Secondary | ICD-10-CM | POA: Insufficient documentation

## 2014-08-30 DIAGNOSIS — Y998 Other external cause status: Secondary | ICD-10-CM | POA: Insufficient documentation

## 2014-08-30 DIAGNOSIS — W11XXXA Fall on and from ladder, initial encounter: Secondary | ICD-10-CM | POA: Insufficient documentation

## 2014-08-30 DIAGNOSIS — Y9389 Activity, other specified: Secondary | ICD-10-CM | POA: Insufficient documentation

## 2014-08-30 DIAGNOSIS — Y9289 Other specified places as the place of occurrence of the external cause: Secondary | ICD-10-CM | POA: Insufficient documentation

## 2014-08-30 DIAGNOSIS — Z87828 Personal history of other (healed) physical injury and trauma: Secondary | ICD-10-CM | POA: Insufficient documentation

## 2014-08-30 MED ORDER — HYDROCODONE-ACETAMINOPHEN 5-325 MG PO TABS
1.0000 | ORAL_TABLET | Freq: Once | ORAL | Status: AC
  Administered 2014-08-30: 1 via ORAL
  Filled 2014-08-30: qty 1

## 2014-08-30 MED ORDER — HYDROCODONE-ACETAMINOPHEN 5-325 MG PO TABS: 1.0000 | ORAL_TABLET | Freq: Four times a day (QID) | ORAL | Status: AC | PRN

## 2014-08-30 NOTE — ED Provider Notes (Signed)
CSN: [id number]     Arrival date & time 08/30/14  1600 History  This chart was scribed for non-physician practitioner, Teressa LowerVrinda Ourania Hamler, NP, working with Raeford RazorStephen Kohut, MD, by Lionel DecemberHatice Demirci, ED Scribe. This patient was seen in room WTR8/WTR8 and the patient's care was started at 4:12 PM.    Chief Complaint  Patient presents with  . Fall  . Back Pain  . Shoulder Pain   (Consider location/radiation/quality/duration/timing/severity/associated sxs/prior Treatment) The history is provided by the patient. No language interpreter was used.    HPI Comments: Frederick DingwallRobert Thompson is a 44 y.o. male who presents to the Emergency Department complaining of low back pain and right shoulder pain after falling off of a 6 ft ladder yesterday and landing on his back.  Patient notes that he was not able to get out of bed this morning and did not lose consciousness with the injury.   Patient reports that he had a back injury previously in October with vertebral fracture.  He has no other complaints today. Denies numbness, incontinence or weakness.   Past Medical History  Diagnosis Date  . Back injury    Past Surgical History  Procedure Laterality Date  . Hernia repair     No family history on file. History  Substance Use Topics  . Smoking status: Current Every Day Smoker    Types: Cigarettes  . Smokeless tobacco: Not on file  . Alcohol Use: Yes    Review of Systems  Musculoskeletal: Positive for back pain.  All other systems reviewed and are negative.     Allergies  Codeine  Home Medications   Prior to Admission medications   Medication Sig Start Date End Date Taking? Authorizing Provider  amoxicillin (AMOXIL) 500 MG capsule Take 1 capsule (500 mg total) by mouth 3 (three) times daily. 03/20/14   Tatyana Kirichenko, PA-C  Aspirin-Acetaminophen (GOODYS BODY PAIN PO) Take 1 each by mouth daily.    Historical Provider, MD  oxyCODONE-acetaminophen (PERCOCET) 5-325 MG per tablet Take 1 tablet by  mouth every 4 (four) hours as needed for severe pain. 03/20/14   Tatyana Kirichenko, PA-C   There were no vitals taken for this visit. Physical Exam  Constitutional: He is oriented to person, place, and time. He appears well-developed and well-nourished. No distress.  HENT:  Head: Normocephalic and atraumatic.  Eyes: Conjunctivae and EOM are normal.  Neck: Neck supple.  Cardiovascular: Normal rate.   Pulmonary/Chest: Effort normal. No respiratory distress.  Musculoskeletal: Normal range of motion.       Cervical back: Normal.       Thoracic back: Normal.       Lumbar back: He exhibits bony tenderness.  Tender in the left right anterior shoulder without deformity. Equal strength an sensation in bilateral lower extremities  Neurological: He is alert and oriented to person, place, and time.  Skin: Skin is warm and dry.  Psychiatric: He has a normal mood and affect. His behavior is normal.  Nursing note and vitals reviewed.   ED Course  Procedures (including critical care time) DIAGNOSTIC STUDIES: Oxygen Saturation is 98% on RA, normal by my interpretation.    COORDINATION OF CARE: 4:15 PM Discussed treatment plan with patient at beside, the patient agrees with the plan and has no further questions at this time.   Labs Review Labs Reviewed - No data to display  Imaging Review Dg Lumbar Spine Complete  08/30/2014   CLINICAL DATA:  Fall, back pain, shoulder pain, fell off ladder 2 days  ago  EXAM: LUMBAR SPINE - COMPLETE 4+ VIEW  COMPARISON:  Sagittal view CT scan 01/08/2014  FINDINGS: Five views of lumbar spine submitted. No acute fracture or subluxation. Stable compression fractures upper endplate of T12 and L1 vertebral bodies. Stable mild anterior spurring upper endplate of L4 vertebral body. Alignment and disc spaces are preserved.  IMPRESSION: No acute fracture or subluxation. Stable mild compression deformity upper endplate of T12 and L1 vertebral body.   Electronically Signed    By: Natasha Mead M.D.   On: 08/30/2014 18:01   Dg Shoulder Right  08/30/2014   CLINICAL DATA:  Larey Seat off ladder 2 days ago, left shoulder pain  EXAM: RIGHT SHOULDER - 2+ VIEW  COMPARISON:  01/08/2014 chest x-ray  FINDINGS: Three views of the right shoulder submitted. No acute fracture or subluxation. Mild degenerative changes AC joint. Glenohumeral joint is preserved. Mild spurring of humeral head.  IMPRESSION: No acute fracture or subluxation.  Mild degenerative changes.   Electronically Signed   By: Natasha Mead M.D.   On: 08/30/2014 17:59     EKG Interpretation None      MDM   Final diagnoses:  Shoulder strain, right, initial encounter  Fall, initial encounter  Other back pain   No acute bony abnormality noted. Pt is neurologically intact. Will treat with pain medication and given ortho referral  I personally performed the services described in this documentation, which was scribed in my presence. The recorded information has been reviewed and is accurate.    Teressa Lower, NP 08/30/14 1610  Raeford Razor, MD 08/30/14 [phone]

## 2014-08-30 NOTE — ED Notes (Signed)
Pt reports fell off of a 6 foot ladder x 3 days ago, landed on his back.  Pt reports low back pain and R shoulder pain.  Pt reports previous injury to back in October.  Pt ambulatory with steady gait.

## 2014-08-30 NOTE — Discharge Instructions (Signed)
Back Pain, Adult °Back pain is very common. The pain often gets better over time. The cause of back pain is usually not dangerous. Most people can learn to manage their back pain on their own.  °HOME CARE  °· Stay active. Start with short walks on flat ground if you can. Try to walk farther each day. °· Do not sit, drive, or stand in one place for more than 30 minutes. Do not stay in bed. °· Do not avoid exercise or work. Activity can help your back heal faster. °· Be careful when you bend or lift an object. Bend at your knees, keep the object close to you, and do not twist. °· Sleep on a firm mattress. Lie on your side, and bend your knees. If you lie on your back, put a pillow under your knees. °· Only take medicines as told by your doctor. °· Put ice on the injured area. °¨ Put ice in a plastic bag. °¨ Place a towel between your skin and the bag. °¨ Leave the ice on for 15-20 minutes, 03-04 times a day for the first 2 to 3 days. After that, you can switch between ice and heat packs. °· Ask your doctor about back exercises or massage. °· Avoid feeling anxious or stressed. Find good ways to deal with stress, such as exercise. °GET HELP RIGHT AWAY IF:  °· Your pain does not go away with rest or medicine. °· Your pain does not go away in 1 week. °· You have new problems. °· You do not feel well. °· The pain spreads into your legs. °· You cannot control when you poop (bowel movement) or pee (urinate). °· Your arms or legs feel weak or lose feeling (numbness). °· You feel sick to your stomach (nauseous) or throw up (vomit). °· You have belly (abdominal) pain. °· You feel like you may pass out (faint). °MAKE SURE YOU:  °· Understand these instructions. °· Will watch your condition. °· Will get help right away if you are not doing well or get worse. °Document Released: 10/29/2007 Document Revised: 08/04/2011 Document Reviewed: 09/13/2013 °[employer]® Patient Information ©2015 [employer], [employer]. This information is not intended  to replace advice given to you by your health care provider. Make sure you discuss any questions you have with your health care provider. ° °

## 2014-10-01 ENCOUNTER — Emergency Department (INDEPENDENT_AMBULATORY_CARE_PROVIDER_SITE_OTHER)
Admission: EM | Admit: 2014-10-01 | Discharge: 2014-10-01 | Disposition: A | Discharge status: Home / Self Care | Payer: Self-pay | Source: Home / Self Care | Attending: Family Medicine | Admitting: Family Medicine

## 2014-10-01 ENCOUNTER — Encounter (HOSPITAL_COMMUNITY): Payer: Self-pay

## 2014-10-01 DIAGNOSIS — M5442 Lumbago with sciatica, left side: Secondary | ICD-10-CM

## 2014-10-01 MED ORDER — KETOROLAC TROMETHAMINE 30 MG/ML IJ SOLN: 30.0000 mg | Freq: Once | INTRAMUSCULAR | Status: DC

## 2014-10-01 MED ORDER — KETOROLAC TROMETHAMINE 60 MG/2ML IM SOLN
INTRAMUSCULAR | Status: AC
  Filled 2014-10-01: qty 2

## 2014-10-01 MED ORDER — BACLOFEN 10 MG PO TABS: 10.0000 mg | ORAL_TABLET | Freq: Three times a day (TID) | ORAL | Status: AC

## 2014-10-01 NOTE — Discharge Instructions (Signed)
USE MEDICINE AS NEEDED AND SEE ORTHOPEDIST IF FURTHER PROBLEMS.

## 2014-10-01 NOTE — ED Notes (Signed)
Reportedly fell from a roof several months ago, and has lingering pain in back. Has not seen an ortho for his problem

## 2014-10-01 NOTE — ED Provider Notes (Signed)
CSN: [id number]     Arrival date & time 10/01/14  [id number] History   First MD Initiated Contact with Patient 10/01/14 1900     Chief Complaint  Patient presents with  . Fall   (Consider location/radiation/quality/duration/timing/severity/associated sxs/prior Treatment) Patient is a 44 y.o. male presenting with back pain. The history is provided by the patient.  Back Pain Location:  Lumbar spine Quality:  Shooting and stabbing Radiates to:  L posterior upper leg Pain severity:  Mild Onset quality:  Gradual Duration:  6 months Progression:  Waxing and waning Chronicity:  Chronic Context comment:  [facility], denies orthopedic care. no gi or gu sx. Relieved by:  None tried Worsened by:  Nothing tried Ineffective treatments:  None tried Associated symptoms: no abdominal pain, no bladder incontinence, no bowel incontinence, no chest pain, no leg pain, no pelvic pain and no weakness     Past Medical History  Diagnosis Date  . Back injury    Past Surgical History  Procedure Laterality Date  . Hernia repair     History reviewed. No pertinent family history. History  Substance Use Topics  . Smoking status: Current Every Day Smoker    Types: Cigarettes  . Smokeless tobacco: Not on file  . Alcohol Use: Yes    Review of Systems  Constitutional: Negative.   Cardiovascular: Negative for chest pain.  Gastrointestinal: Negative.  Negative for abdominal pain and bowel incontinence.  Genitourinary: Negative.  Negative for bladder incontinence and pelvic pain.  Musculoskeletal: Positive for back pain and gait problem. Negative for joint swelling.  Skin: Negative.   Neurological: Negative for weakness.    Allergies  Codeine  Home Medications   Prior to Admission medications   Medication Sig Start Date End Date Taking? Authorizing Provider  amoxicillin (AMOXIL) 500 MG capsule Take 1 capsule (500 mg total) by mouth 3 (three) times daily. 03/20/14    Tatyana Kirichenko, PA-C  Aspirin-Acetaminophen (GOODYS BODY PAIN PO) Take 1 each by mouth daily.    Historical Provider, MD  baclofen (LIORESAL) 10 MG tablet Take 1 tablet (10 mg total) by mouth 3 (three) times daily. FOR BACK PAIN 10/01/14   Linna HoffJames D Laterrica Libman, MD  HYDROcodone-acetaminophen (NORCO/VICODIN) 5-325 MG per tablet Take 1-2 tablets by mouth every 6 (six) hours as needed. 08/30/14   Teressa LowerVrinda Pickering, NP  oxyCODONE-acetaminophen (PERCOCET) 5-325 MG per tablet Take 1 tablet by mouth every 4 (four) hours as needed for severe pain. 03/20/14   Tatyana Kirichenko, PA-C   BP 137/74 mmHg  Pulse 69  Temp(Src) 98.1 F (36.7 C) (Oral)  Resp 20  SpO2 99% Physical Exam  Constitutional: He is oriented to person, place, and time. He appears well-developed and well-nourished.  Musculoskeletal: He exhibits tenderness.       Lumbar back: He exhibits decreased range of motion, tenderness, pain and spasm. He exhibits no swelling, no edema, no deformity and normal pulse.       Back:  Neurological: He is alert and oriented to person, place, and time.  Skin: Skin is warm and dry.  Nursing note and vitals reviewed.   ED Course  Procedures (including critical care time) Labs Review Labs Reviewed - No data to display  Imaging Review No results found.   MDM   1. Midline low back pain with left-sided sciatica        Linna HoffJames D Genene Kilman, MD 10/01/14 [phone]

## 2014-11-01 ENCOUNTER — Encounter (HOSPITAL_COMMUNITY): Payer: Self-pay | Admitting: *Deleted

## 2014-11-01 ENCOUNTER — Emergency Department (HOSPITAL_COMMUNITY)
Admission: EM | Admit: 2014-11-01 | Discharge: 2014-11-02 | Disposition: A | Discharge status: Home / Self Care | Payer: Self-pay | Attending: Emergency Medicine | Admitting: Emergency Medicine

## 2014-11-01 DIAGNOSIS — Z7982 Long term (current) use of aspirin: Secondary | ICD-10-CM | POA: Insufficient documentation

## 2014-11-01 DIAGNOSIS — Z79899 Other long term (current) drug therapy: Secondary | ICD-10-CM | POA: Insufficient documentation

## 2014-11-01 DIAGNOSIS — L02211 Cutaneous abscess of abdominal wall: Secondary | ICD-10-CM | POA: Insufficient documentation

## 2014-11-01 DIAGNOSIS — Z720 Tobacco use: Secondary | ICD-10-CM | POA: Insufficient documentation

## 2014-11-01 NOTE — ED Notes (Signed)
Pt states that he has had a "boil" to his rt hip / waistline area for approx 1 week; pt states that it has progressively gotten worse; pt states that his wife tried squeezing it yesterday with no results

## 2014-11-02 MED ORDER — OXYCODONE-ACETAMINOPHEN 5-325 MG PO TABS
1.0000 | ORAL_TABLET | Freq: Once | ORAL | Status: AC
  Administered 2014-11-02: 1 via ORAL
  Filled 2014-11-02: qty 1

## 2014-11-02 MED ORDER — SULFAMETHOXAZOLE-TRIMETHOPRIM 800-160 MG PO TABS: 1.0000 | ORAL_TABLET | Freq: Two times a day (BID) | ORAL | Status: AC

## 2014-11-02 MED ORDER — SULFAMETHOXAZOLE-TRIMETHOPRIM 800-160 MG PO TABS
1.0000 | ORAL_TABLET | Freq: Once | ORAL | Status: AC
  Administered 2014-11-02: 1 via ORAL
  Filled 2014-11-02: qty 1

## 2014-11-02 MED ORDER — OXYCODONE-ACETAMINOPHEN 5-325 MG PO TABS: 1.0000 | ORAL_TABLET | ORAL | Status: AC | PRN

## 2014-11-02 MED ORDER — LIDOCAINE-EPINEPHRINE (PF) 2 %-1:200000 IJ SOLN
20.0000 mL | Freq: Once | INTRAMUSCULAR | Status: AC
  Administered 2014-11-02: 20 mL via INTRADERMAL
  Filled 2014-11-02: qty 20

## 2014-11-02 NOTE — ED Provider Notes (Signed)
CSN: [id number]     Arrival date & time 11/01/14  2336 History   First MD Initiated Contact with Patient 11/02/14 0007     Chief Complaint  Patient presents with  . Abscess     (Consider location/radiation/quality/duration/timing/severity/associated sxs/prior Treatment) Patient is a 44 y.o. male presenting with abscess. The history is provided by the patient. No language interpreter was used.  Abscess Location:  Torso Torso abscess location:  Abd RLQ Abscess quality: draining, painful and redness   Red streaking: yes   Duration:  1 week Progression:  Worsening Associated symptoms: no fever and no nausea   Associated symptoms comment:  Painful swollen area to RLQ abdominal wall that has been draining. No fever. He presents now with increasing pain and surrounding redness. 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   Past Medical History  Diagnosis Date  . Back injury    Past Surgical History  Procedure Laterality Date  . Hernia repair    . Back surgery     No family history on file. History  Substance Use Topics  . Smoking status: Current Every Day Smoker    Types: Cigarettes  . Smokeless tobacco: Not on file  . Alcohol Use: Yes    Review of Systems  Constitutional: Negative for fever.  Gastrointestinal: Negative for nausea.  Musculoskeletal: Negative for myalgias.  Skin:       See HPI.      Allergies  Codeine  Home Medications   Prior to Admission medications   Medication Sig Start Date End Date Taking? Authorizing Provider  Aspirin-Acetaminophen (GOODYS BODY PAIN PO) Take 2 packets by mouth 3 (three) times daily as needed (back pain).    Yes Historical Provider, MD  baclofen (LIORESAL) 10 MG tablet Take 1 tablet (10 mg total) by mouth 3 (three) times daily. FOR BACK PAIN 10/01/14  Yes Linna Hoff, MD  HYDROcodone-acetaminophen (NORCO/VICODIN) 5-325 MG per tablet Take 1-2 tablets by mouth every 6 (six) hours as needed. Patient not taking: Reported on 11/02/2014 08/30/14    Teressa Lower, NP   BP 118/76 mmHg  Pulse 92  Temp(Src) 98.1 F (36.7 C) (Oral)  Resp 22  Ht  (1.676 m)  Wt 155 lb (70.308 kg)  BMI 25.03 kg/m2  SpO2 98% Physical Exam  Constitutional: He is oriented to person, place, and time. He appears well-developed and well-nourished.  Neck: Normal range of motion.  Pulmonary/Chest: Effort normal.  Musculoskeletal: Normal range of motion.  Neurological: He is alert and oriented to person, place, and time.  Skin: Skin is warm and dry.  Large indurated swollen area RLQ abdomen with central ulceration. No drainage. Erythema surrounding ulceration extending approximately 6 cm medially and laterally   Psychiatric: He has a normal mood and affect.    ED Course  Procedures (including critical care time) Labs Review Labs Reviewed - No data to display  Imaging Review No results found.   EKG Interpretation None     INCISION AND DRAINAGE Performed by: Elpidio Anis A Consent: Verbal consent obtained. Risks and benefits: risks, benefits and alternatives were discussed Type: abscess  Body area: RLQ abdomen  Anesthesia: local infiltration  Incision was made with a scalpel.  Local anesthetic: lidocaine 2% w/epinephrine  Anesthetic total: 3 ml  Complexity: complex Blunt dissection to break up loculations  Drainage: purulent  Drainage amount: small  Packing material: 1/4 in iodoform gauze  Patient tolerance: Patient tolerated the procedure well with no immediate complications.    MDM  Final diagnoses:  None    1. Cutaneous abscess  Uncomplicated abscess, I&D, mild cellulitis. Well appearing patient. Pain management provided with Septra for infection. REcommended 2 day recheck.     Elpidio Anis, PA-C 11/02/14 0103  Elwin Mocha, MD 11/02/14 [phone]

## 2014-11-02 NOTE — Discharge Instructions (Signed)

## 2015-01-06 ENCOUNTER — Emergency Department (HOSPITAL_COMMUNITY)
Admission: EM | Admit: 2015-01-06 | Discharge: 2015-01-06 | Disposition: A | Discharge status: Home / Self Care | Payer: Self-pay

## 2015-01-06 NOTE — ED Notes (Signed)
1x call no answer.  

## 2015-01-06 NOTE — ED Notes (Signed)
Patient has been caled back 4 times per registration they think they saw patient walk out. This Clinical research associate has checked outside, no one out there.

## 2015-01-06 NOTE — ED Notes (Signed)
Bed: WTR8 Expected date:  Expected time:  Means of arrival:  Comments: 

## 2015-08-09 ENCOUNTER — Encounter (HOSPITAL_COMMUNITY): Payer: Self-pay | Admitting: Emergency Medicine

## 2015-08-09 ENCOUNTER — Emergency Department (HOSPITAL_COMMUNITY)
Admission: EM | Admit: 2015-08-09 | Discharge: 2015-08-09 | Disposition: A | Discharge status: Home / Self Care | Payer: Self-pay | Attending: Emergency Medicine | Admitting: Emergency Medicine

## 2015-08-09 ENCOUNTER — Emergency Department (HOSPITAL_COMMUNITY): Payer: Self-pay

## 2015-08-09 DIAGNOSIS — T71164A Asphyxiation due to hanging, undetermined, initial encounter: Secondary | ICD-10-CM

## 2015-08-09 DIAGNOSIS — R21 Rash and other nonspecific skin eruption: Secondary | ICD-10-CM | POA: Insufficient documentation

## 2015-08-09 DIAGNOSIS — Y9289 Other specified places as the place of occurrence of the external cause: Secondary | ICD-10-CM | POA: Insufficient documentation

## 2015-08-09 DIAGNOSIS — F1410 Cocaine abuse, uncomplicated: Secondary | ICD-10-CM | POA: Insufficient documentation

## 2015-08-09 DIAGNOSIS — T71191A Asphyxiation due to mechanical threat to breathing due to other causes, accidental, initial encounter: Secondary | ICD-10-CM | POA: Insufficient documentation

## 2015-08-09 DIAGNOSIS — Y9389 Activity, other specified: Secondary | ICD-10-CM | POA: Insufficient documentation

## 2015-08-09 DIAGNOSIS — Y998 Other external cause status: Secondary | ICD-10-CM | POA: Insufficient documentation

## 2015-08-09 DIAGNOSIS — F17210 Nicotine dependence, cigarettes, uncomplicated: Secondary | ICD-10-CM | POA: Insufficient documentation

## 2015-08-09 DIAGNOSIS — S1093XA Contusion of unspecified part of neck, initial encounter: Secondary | ICD-10-CM | POA: Insufficient documentation

## 2015-08-09 DIAGNOSIS — F1210 Cannabis abuse, uncomplicated: Secondary | ICD-10-CM | POA: Insufficient documentation

## 2015-08-09 DIAGNOSIS — X838XXA Intentional self-harm by other specified means, initial encounter: Secondary | ICD-10-CM | POA: Insufficient documentation

## 2015-08-09 LAB — CBC WITH DIFFERENTIAL/PLATELET
BASOS ABS: 0 10*3/uL (ref 0.0–0.1)
Basophils Relative: 0 %
EOS ABS: 0.1 10*3/uL (ref 0.0–0.7)
EOS PCT: 2 %
HCT: 45.7 % (ref 39.0–52.0)
HEMOGLOBIN: 15.6 g/dL (ref 13.0–17.0)
LYMPHS ABS: 1.3 10*3/uL (ref 0.7–4.0)
LYMPHS PCT: 21 %
MCH: 31 pg (ref 26.0–34.0)
MCHC: 34.1 g/dL (ref 30.0–36.0)
MCV: 90.9 fL (ref 78.0–100.0)
Monocytes Absolute: 0.4 10*3/uL (ref 0.1–1.0)
Monocytes Relative: 6 %
NEUTROS PCT: 71 %
Neutro Abs: 4.5 10*3/uL (ref 1.7–7.7)
PLATELETS: 198 10*3/uL (ref 150–400)
RBC: 5.03 MIL/uL (ref 4.22–5.81)
RDW: 14.2 % (ref 11.5–15.5)
WBC: 6.3 10*3/uL (ref 4.0–10.5)

## 2015-08-09 LAB — I-STAT CHEM 8, ED
BUN: 5 mg/dL — AB (ref 6–20)
CHLORIDE: 104 mmol/L (ref 101–111)
Calcium, Ion: 1.12 mmol/L (ref 1.12–1.23)
Creatinine, Ser: 0.7 mg/dL (ref 0.61–1.24)
Glucose, Bld: 79 mg/dL (ref 65–99)
HCT: 49 % (ref 39.0–52.0)
Hemoglobin: 16.7 g/dL (ref 13.0–17.0)
POTASSIUM: 4.2 mmol/L (ref 3.5–5.1)
SODIUM: 141 mmol/L (ref 135–145)
TCO2: 23 mmol/L (ref 0–100)

## 2015-08-09 LAB — RAPID URINE DRUG SCREEN, HOSP PERFORMED
AMPHETAMINES: NOT DETECTED
BENZODIAZEPINES: NOT DETECTED
Barbiturates: NOT DETECTED
COCAINE: POSITIVE — AB
OPIATES: NOT DETECTED
TETRAHYDROCANNABINOL: POSITIVE — AB

## 2015-08-09 LAB — ETHANOL: ALCOHOL ETHYL (B): 27 mg/dL — AB (ref <5)
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
## 2015-08-09 NOTE — Discharge Instructions (Signed)
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
## 2015-08-09 NOTE — ED Notes (Signed)
Pt here from jail after attempting  To hang himself  With a shoelace pt was red according to jail staff. Pt in nad  On arrival

## 2016-09-11 IMAGING — CT CT CERVICAL SPINE W/O CM
2 of 3 series · 10 of 27 positions shown, 13 images · non-contrast
Comparison: None

CLINICAL DATA: Attempted to hang himself with a shoe lace

EXAM:
CT CERVICAL SPINE WITHOUT CONTRAST
TECHNIQUE: Multidetector CT imaging of the cervical spine was performed without
intravenous contrast.
Multiplanar CT image reconstructions were also generated.

[Series 205: sag · sagittal · 0.40mm/px · 5 of 47 slices shown, 6 images]
[im 16/47  bone]
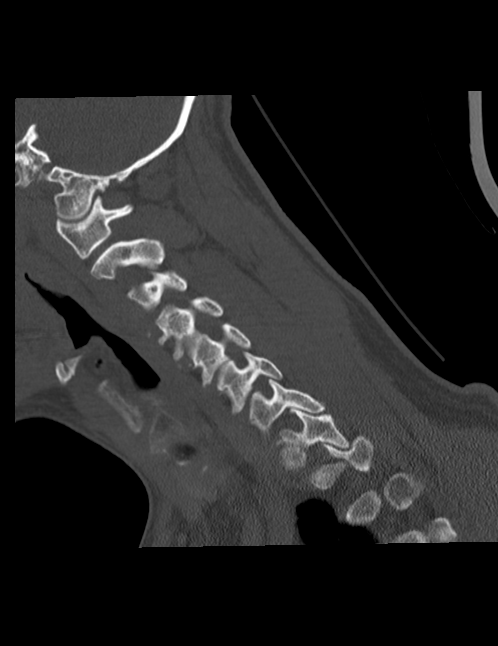
[im 20/47  bone]
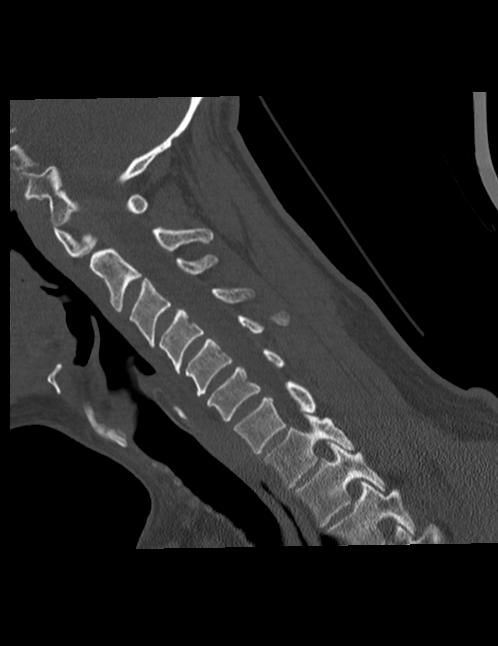
[im 24/47  soft-tissue]
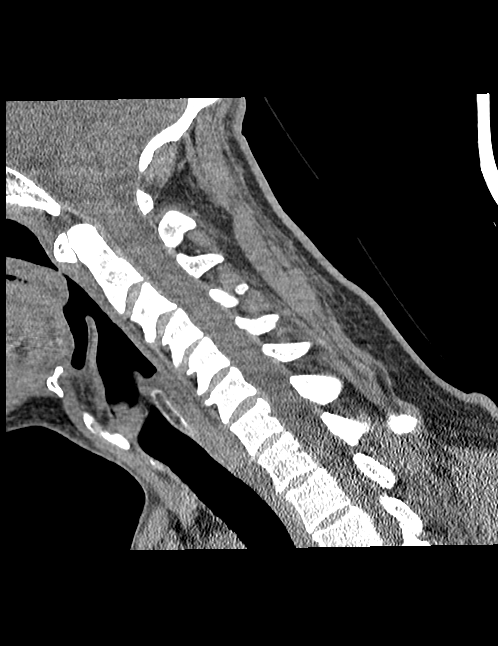
[im 24/47  bone]
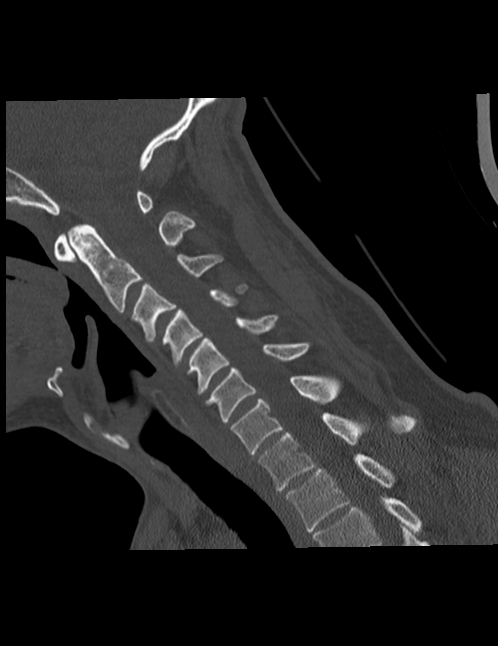
[im 27/47  bone]
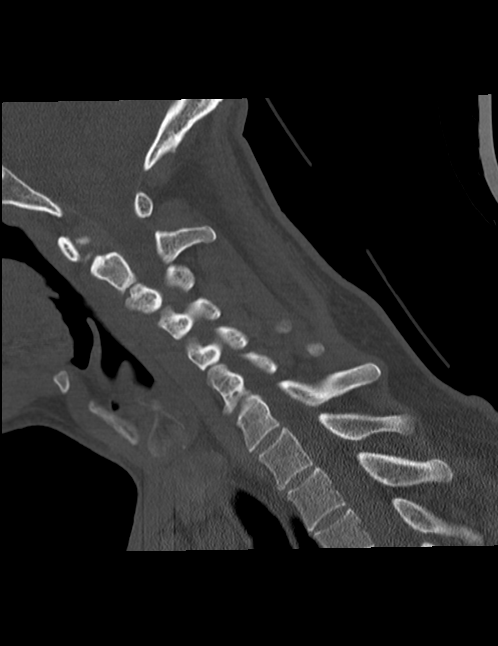
[im 31/47  bone]
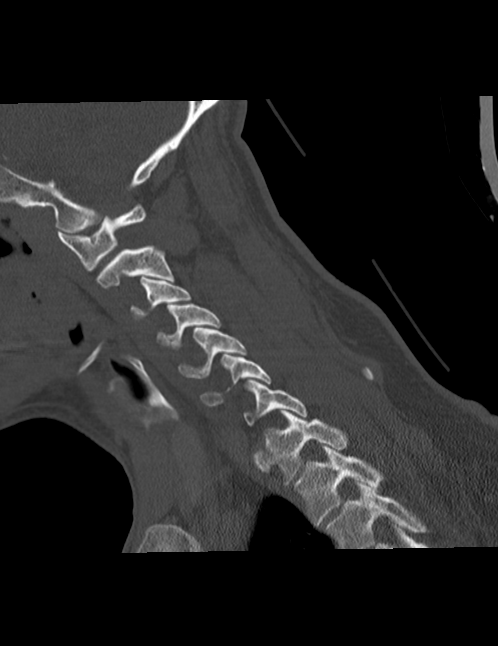

[Series 206: orthogonal · axial · 0.39mm/px · z∈[+80,+175]mm · 5 of 85 slices shown, 7 images]
[im 15/85  soft-tissue]
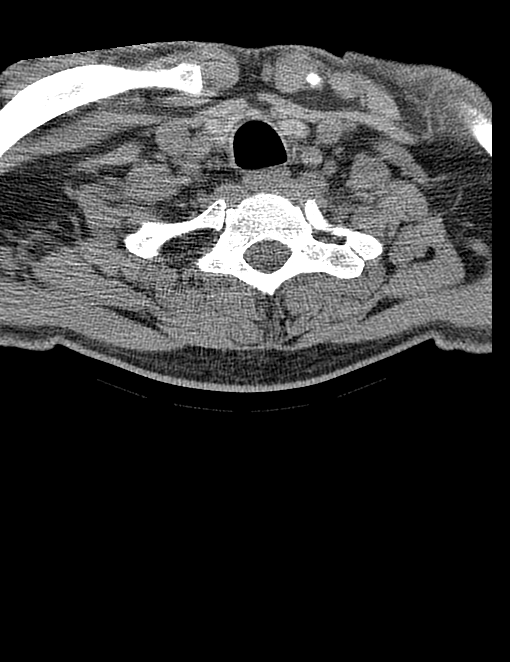
[im 15/85  bone]
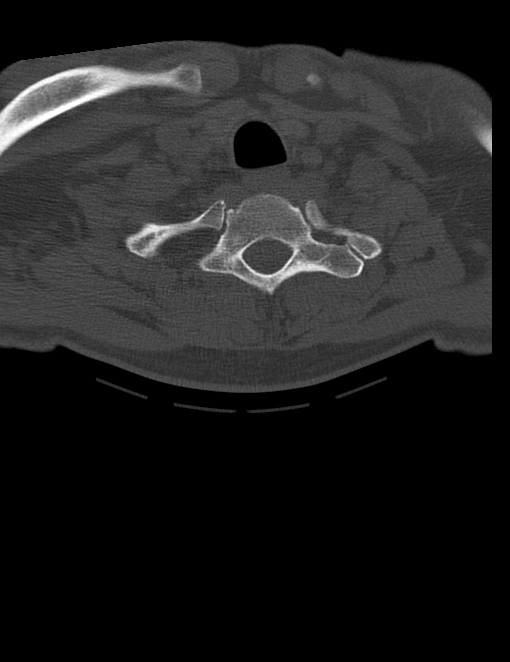
[im 29/85  bone]
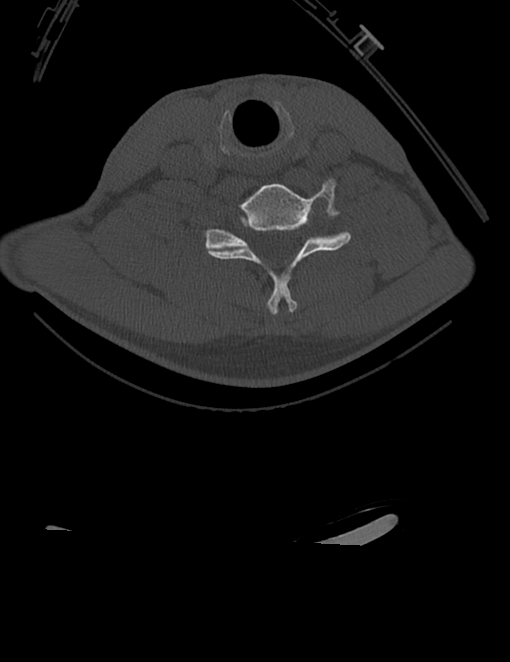
[im 43/85  bone]
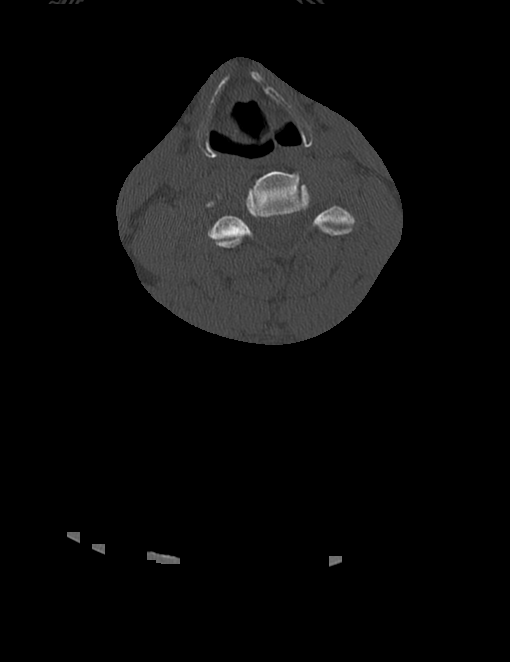
[im 57/85  bone]
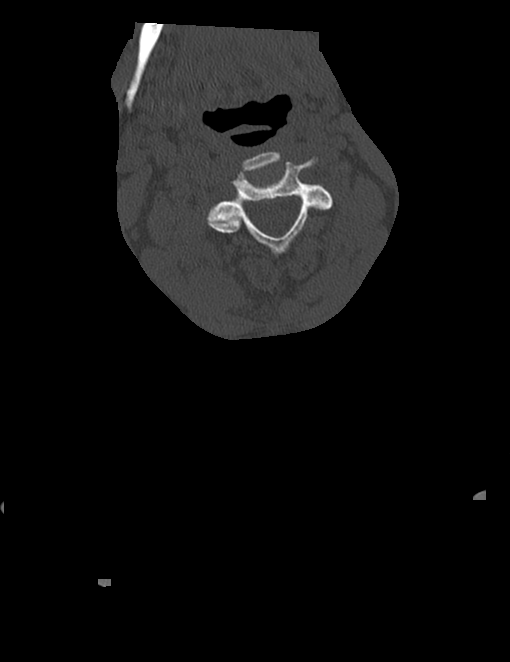
[im 71/85  soft-tissue]
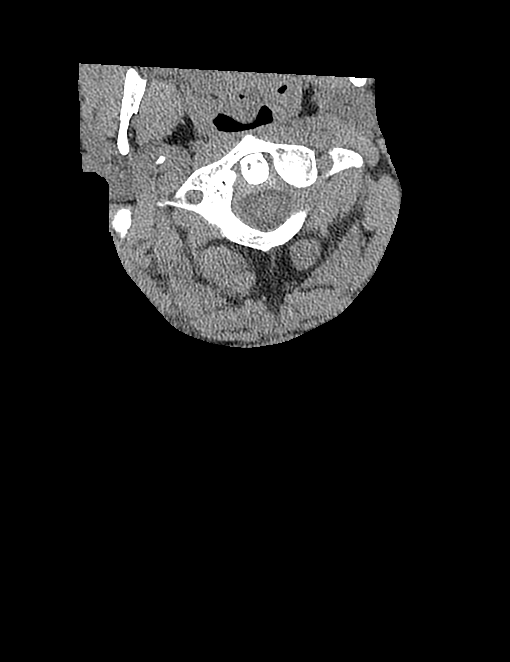
[im 71/85  bone]
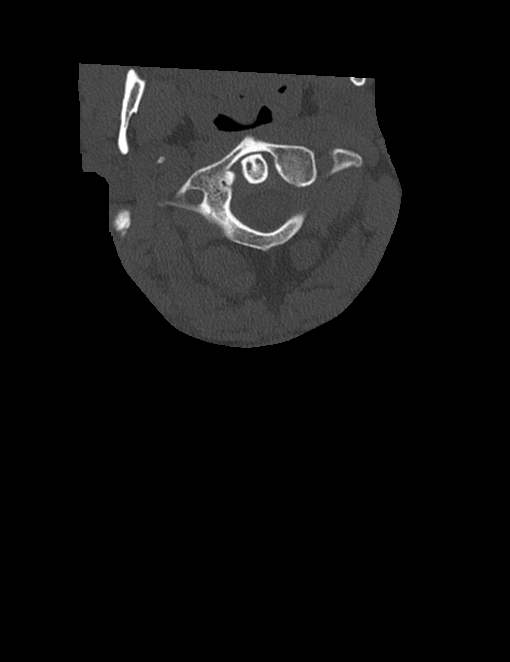

[10 of 27 positions shown; findings below may reference images not displayed]

FINDINGS: Prevertebral soft tissues normal thickness.

Osseous mineralization normal.

Mild disc space narrowing and endplate spur formation C3-C4 through
C5-C6.

Vertebral body heights maintained without fracture or subluxation.

Lateral cervical flexion to the RIGHT.

Visualized skullbase intact.

Lung apices clear.
IMPRESSION: No acute osseous abnormalities.
